# Patient Record
Sex: Female | Born: 2000 | Race: White | Hispanic: No | Marital: Single | Smoking: Never smoker
Health system: Southern US, Community
[De-identification: ages and names within clinical notes are randomized; demographics above are authoritative.]

## PROBLEM LIST (undated history)

## (undated) DIAGNOSIS — L309 Dermatitis, unspecified: Secondary | ICD-10-CM

## (undated) HISTORY — PX: ADENOIDECTOMY: SUR15

## (undated) HISTORY — PX: TONSILLECTOMY: SUR1361

## (undated) HISTORY — PX: MYRINGOTOMY WITH TUBE PLACEMENT: SHX5663

---
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
## 2022-02-24 ENCOUNTER — Emergency Department (HOSPITAL_COMMUNITY): Payer: BC Managed Care – PPO

## 2022-02-24 ENCOUNTER — Encounter (HOSPITAL_COMMUNITY): Payer: Self-pay | Admitting: Emergency Medicine

## 2022-02-24 ENCOUNTER — Other Ambulatory Visit: Payer: Self-pay

## 2022-02-24 ENCOUNTER — Emergency Department (HOSPITAL_COMMUNITY)
Admission: EM | Admit: 2022-02-24 | Discharge: 2022-02-24 | Disposition: A | Discharge status: Home / Self Care | Payer: BC Managed Care – PPO | Attending: Emergency Medicine | Admitting: Emergency Medicine

## 2022-02-24 DIAGNOSIS — R1110 Vomiting, unspecified: Secondary | ICD-10-CM | POA: Diagnosis not present

## 2022-02-24 DIAGNOSIS — D72829 Elevated white blood cell count, unspecified: Secondary | ICD-10-CM | POA: Diagnosis not present

## 2022-02-24 DIAGNOSIS — R1031 Right lower quadrant pain: Secondary | ICD-10-CM | POA: Diagnosis not present

## 2022-02-24 DIAGNOSIS — R1032 Left lower quadrant pain: Secondary | ICD-10-CM | POA: Diagnosis not present

## 2022-02-24 LAB — CBC WITH DIFFERENTIAL/PLATELET
Abs Immature Granulocytes: 0.02 10*3/uL (ref 0.00–0.07)
Basophils Absolute: 0.1 10*3/uL (ref 0.0–0.1)
Basophils Relative: 1 %
Eosinophils Absolute: 0.1 10*3/uL (ref 0.0–0.5)
Eosinophils Relative: 1 %
HCT: 37.7 % (ref 36.0–46.0)
Hemoglobin: 11.6 g/dL — ABNORMAL LOW (ref 12.0–15.0)
Immature Granulocytes: 0 %
Lymphocytes Relative: 33 %
Lymphs Abs: 3.5 10*3/uL (ref 0.7–4.0)
MCH: 25.8 pg — ABNORMAL LOW (ref 26.0–34.0)
MCHC: 30.8 g/dL (ref 30.0–36.0)
MCV: 83.8 fL (ref 80.0–100.0)
Monocytes Absolute: 0.7 10*3/uL (ref 0.1–1.0)
Monocytes Relative: 6 %
Neutro Abs: 6.4 10*3/uL (ref 1.7–7.7)
Neutrophils Relative %: 59 %
Platelets: 279 10*3/uL (ref 150–400)
RBC: 4.5 MIL/uL (ref 3.87–5.11)
RDW: 13.8 % (ref 11.5–15.5)
WBC: 10.7 10*3/uL — ABNORMAL HIGH (ref 4.0–10.5)
nRBC: 0 % (ref 0.0–0.2)

## 2022-02-24 LAB — COMPREHENSIVE METABOLIC PANEL
ALT: 18 U/L (ref 0–44)
AST: 18 U/L (ref 15–41)
Albumin: 3.9 g/dL (ref 3.5–5.0)
Alkaline Phosphatase: 64 U/L (ref 38–126)
Anion gap: 6 (ref 5–15)
BUN: 13 mg/dL (ref 6–20)
CO2: 25 mmol/L (ref 22–32)
Calcium: 9.1 mg/dL (ref 8.9–10.3)
Chloride: 105 mmol/L (ref 98–111)
Creatinine, Ser: 0.7 mg/dL (ref 0.44–1.00)
GFR, Estimated: >60 mL/min (ref >60)
Glucose, Bld: 152 mg/dL — ABNORMAL HIGH (ref 70–99)
Potassium: 3.7 mmol/L (ref 3.5–5.1)
Sodium: 136 mmol/L (ref 135–145)
Total Bilirubin: 0.4 mg/dL (ref 0.3–1.2)
Total Protein: 6.9 g/dL (ref 6.5–8.1)

## 2022-02-24 LAB — URINALYSIS, ROUTINE W REFLEX MICROSCOPIC
Bilirubin Urine: NEGATIVE
Glucose, UA: NEGATIVE mg/dL
Hgb urine dipstick: NEGATIVE
Ketones, ur: NEGATIVE mg/dL
Nitrite: NEGATIVE
Protein, ur: NEGATIVE mg/dL
Specific Gravity, Urine: 1.017 (ref 1.005–1.030)
pH: 6 (ref 5.0–8.0)

## 2022-02-24 LAB — I-STAT BETA HCG BLOOD, ED (MC, WL, AP ONLY): I-stat hCG, quantitative: <5 m[IU]/mL (ref <5)

## 2022-02-24 MED ORDER — IOHEXOL 300 MG/ML  SOLN
100.0000 mL | Freq: Once | INTRAMUSCULAR | Status: AC | PRN
  Administered 2022-02-24: 100 mL via INTRAVENOUS

## 2022-02-24 MED ORDER — ONDANSETRON HCL 4 MG/2ML IJ SOLN
4.0000 mg | Freq: Once | INTRAMUSCULAR | Status: AC
  Administered 2022-02-24: 4 mg via INTRAVENOUS
  Filled 2022-02-24: qty 2

## 2022-02-24 MED ORDER — SODIUM CHLORIDE (PF) 0.9 % IJ SOLN
INTRAMUSCULAR | Status: AC
  Filled 2022-02-24: qty 50

## 2022-02-24 MED ORDER — KETOROLAC TROMETHAMINE 15 MG/ML IJ SOLN
15.0000 mg | Freq: Once | INTRAMUSCULAR | Status: AC
  Administered 2022-02-24: 15 mg via INTRAVENOUS
  Filled 2022-02-24: qty 1

## 2022-02-24 NOTE — ED Triage Notes (Addendum)
Pt presents for  sudden onset lower abd pain (RLQ is greatest) that woke her from sleep, feels like cramping and stabbing. IUD placed in December (Mirena).  No previous episodes.  Denies urinary sx, fever Endorses chills, emesis x 4 Last PO intake 1 hr ago.  No abd surgical hx.  Last BM today.

## 2022-02-24 NOTE — ED Notes (Signed)
Unable to obtain UA sample. [place] at bedside. Will re-attempt later

## 2022-02-24 NOTE — Discharge Instructions (Addendum)
You were seen in the ER today for your abdominal pain.  While the exact cause of your symptoms remains unclear there is not appear to be any emergent problem at this time.  Symptoms may have been caused by a ruptured ovarian cyst.  Please follow-up with a primary care doctor for inflammatory changes around your aorta which were noted on your CT scan.  You may take Tylenol or ibuprofen as needed for your symptoms.  Return to the ER with any new severe symptoms.

## 2022-02-24 NOTE — ED Provider Notes (Signed)
[facility] Provider Note   CSN: [id number] Arrival date & time: 02/24/22  0140     History  Chief Complaint  Patient presents with   Abdominal Pain    Sabrina Humphrey is a 22 y.o. female who presents with her girlfriend at the bedside with concern for sudden onset right lower abdominal pain that woke her from her sleep.  States that around 11 PM she and her partner had intercourse, around 1230 began to have some mild vague soreness in the right side.  Fell asleep and around 1:30 AM woke up reportedly screaming in pain secondary to pain in the right lower abdomen, radiates to the left side at this time.  IUD placed in December, Mirena.  Denies vaginal bleeding or discharge at this time.  Sharp stabbing pain.  Associated vomiting with NBNB emesis when pain was most severe.  Endorses chills sensation.  No surgical history in the abdomen.  Last bowel movement today was normal, no urinary symptoms.    HPI     Home Medications Prior to Admission medications   Medication Sig Start Date End Date Taking? Authorizing Provider  ondansetron (ZOFRAN) 4 MG tablet Take 1 tablet (4 mg total) by mouth every 6 (six) hours. 04/23/16   Noe Gens, PA-C  UNKNOWN TO PATIENT     [provider]      Allergies    Penicillins and Sulfa antibiotics    Review of Systems   Review of Systems  Constitutional:  Positive for appetite change and chills. Negative for activity change, fatigue and fever.  HENT:  Positive for congestion.   Gastrointestinal:  Positive for abdominal pain, nausea and vomiting. Negative for anal bleeding, blood in stool, constipation and diarrhea.  Genitourinary:  Positive for pelvic pain. Negative for decreased urine volume, dysuria, enuresis, flank pain, frequency, genital sores, hematuria, menstrual problem, urgency, vaginal bleeding, vaginal discharge and vaginal pain.    Physical Exam Updated Vital Signs BP 99/63 (BP  Location: Right Arm)   Pulse 62   Temp 97.8 F (36.6 C)   Resp 18   Wt 70.3 kg   SpO2 99%  Physical Exam Vitals and nursing note reviewed.  Constitutional:      Appearance: She is not ill-appearing or toxic-appearing.  HENT:     Head: Normocephalic and atraumatic.     Mouth/Throat:     Mouth: Mucous membranes are moist.     Pharynx: No oropharyngeal exudate or posterior oropharyngeal erythema.  Eyes:     General:        Right eye: No discharge.        Left eye: No discharge.     Conjunctiva/sclera: Conjunctivae normal.  Cardiovascular:     Rate and Rhythm: Normal rate and regular rhythm.     Pulses: Normal pulses.     Heart sounds: Normal heart sounds. No murmur heard. Pulmonary:     Effort: Pulmonary effort is normal. No respiratory distress.     Breath sounds: Normal breath sounds. No wheezing or rales.  Abdominal:     General: Bowel sounds are normal. There is no distension.     Palpations: Abdomen is soft.     Tenderness: There is abdominal tenderness in the right lower quadrant, suprapubic area and left lower quadrant. There is no right CVA tenderness, left CVA tenderness, guarding or rebound.     Hernia: No hernia is present.  Musculoskeletal:        General: No  deformity.     Cervical back: Neck supple.  Skin:    General: Skin is warm and dry.     Capillary Refill: Capillary refill takes less than 2 seconds.  Neurological:     General: No focal deficit present.     Mental Status: She is alert and oriented to person, place, and time. Mental status is at baseline.  Psychiatric:        Mood and Affect: Mood normal.     ED Results / Procedures / Treatments   Labs (all labs ordered are listed, but only abnormal results are displayed) Labs Reviewed  CBC WITH DIFFERENTIAL/PLATELET - Abnormal; Notable for the following components:      Result Value   WBC 10.7 (*)    Hemoglobin 11.6 (*)    MCH 25.8 (*)    All other components within normal limits  COMPREHENSIVE  METABOLIC PANEL - Abnormal; Notable for the following components:   Glucose, Bld 152 (*)    All other components within normal limits  URINALYSIS, ROUTINE W REFLEX MICROSCOPIC - Abnormal; Notable for the following components:   APPearance HAZY (*)    Leukocytes,Ua TRACE (*)    Bacteria, UA FEW (*)    All other components within normal limits  I-STAT BETA HCG BLOOD, ED ([facility], [facility], [facility] ONLY)    EKG None  Radiology CT ABDOMEN PELVIS W CONTRAST  Result Date: 02/24/2022 CLINICAL DATA:  Right lower quadrant pain. EXAM: CT ABDOMEN AND PELVIS WITH CONTRAST TECHNIQUE: Multidetector CT imaging of the abdomen and pelvis was performed using the standard protocol following bolus administration of intravenous contrast. RADIATION DOSE REDUCTION: This exam was performed according to the departmental dose-optimization program which includes automated exposure control, adjustment of the mA and/or kV according to patient size and/or use of iterative reconstruction technique. CONTRAST:  147mL OMNIPAQUE IOHEXOL 300 MG/ML  SOLN COMPARISON:  Pelvic ultrasound earlier today showing no significant findings. FINDINGS: Lower chest: No acute abnormality. Hepatobiliary: Gallbladder is contracted and not well seen but there is no pericholecystic edema, fluid or biliary dilatation. No focal liver parenchymal abnormality. Pancreas: No abnormality. Spleen: No abnormality. Adrenals/Urinary Tract: Adrenal glands are unremarkable. Kidneys are normal, without renal calculi, focal lesion, or hydronephrosis. Bladder is unremarkable. Stomach/Bowel: The gastric wall and unopacified small bowel are unremarkable. An appendix is not seen in this patient. There is moderate retained stool in the ascending, transverse and descending colon. The rectosigmoid segment is unremarkable. There are no erosive colitis or diverticulitis. Vascular/Lymphatic: No significant vascular findings are present. No enlarged abdominal or pelvic lymph nodes. Reproductive:  The uterus is intact, anteverted and tilted right with IUD in place with the T arms in the distal cavity. The ovaries are not enlarged. There is mild low-density cul-de-sac free fluid which was seen on the ultrasound and some of it tracks into the distal most paracolic gutters. Finding is nonspecific, could be physiologic or due to a ruptured ovarian cyst. Other: As above there is mild fluid in the pelvis and distal paracolic gutters. There is no free hemorrhage, free air, abscess or acute inflammatory changes. There is subtle fat stranding the retroperitoneum along both sides of the abdominal aorta, nonspecific and could be due to an inflammatory process whether active or prior, or could be due to retroperitoneal fibrosis. There are no incarcerated hernias. There is a tiny umbilical fat hernia. Musculoskeletal: Transitional anatomy lumbosacral junction. Slight lumbar levoscoliosis. No acute or significant osseous findings. IMPRESSION: 1. Mild low-density cul-de-sac free fluid which was seen  on the pelvic ultrasound. This could be physiologic or due to a ruptured ovarian cyst. 2. Appendix not seen in this patient. No secondary findings of appendicitis. 3. Constipation. 4. Subtle fat stranding along both sides of the abdominal aorta, nonspecific. This could be due to an inflammatory process whether active or prior, or could be due to retroperitoneal fibrosis. Electronically Signed   By: Almira Bar M.D.   On: 02/24/2022 04:57   US Pelvis Complete  Result Date: 02/24/2022 CLINICAL DATA:  Sudden onset right-sided pelvic pain for 2 hours. Unsure LMP. EXAM: TRANSABDOMINAL ULTRASOUND OF PELVIS DOPPLER ULTRASOUND OF OVARIES TECHNIQUE: Transabdominal ultrasound examination of the pelvis was performed including evaluation of the uterus, ovaries, adnexal regions, and pelvic cul-de-sac. Color and duplex Doppler ultrasound was utilized to evaluate blood flow to the ovaries. COMPARISON:  None Available. FINDINGS: Uterus  Measurements: 6 x 3.1 x 5.1 cm = volume: 49 mL. Uterus is anteverted. No fibroids or other mass visualized. Endometrium Thickness: 5 mm. Echogenic filling defect centrally in the endometrium consistent with intrauterine device. Right ovary Measurements: 4 x 1.6 x 2.6 cm = volume: 9 mL. Normal appearance/no adnexal mass. Left ovary Measurements: 4 x 1.5 x 1.6 cm = volume: 5 mL. Normal appearance/no adnexal mass. Pulsed Doppler evaluation demonstrates normal low-resistance arterial and venous waveforms in both ovaries. Other: Trace free fluid in the pelvis is nonspecific but likely physiologic. IMPRESSION: Normal ultrasound appearance of the uterus and ovaries. No evidence of ovarian mass or torsion. Small amount of free fluid in the pelvis is likely physiologic. IUD is present. Electronically Signed   By: Burman Nieves M.D.   On: 02/24/2022 03:30   [place] Art/Ven Flow Abd Pelv Doppler  Result Date: 02/24/2022 CLINICAL DATA:  Sudden onset right-sided pelvic pain for 2 hours. Unsure LMP. EXAM: TRANSABDOMINAL ULTRASOUND OF PELVIS DOPPLER ULTRASOUND OF OVARIES TECHNIQUE: Transabdominal ultrasound examination of the pelvis was performed including evaluation of the uterus, ovaries, adnexal regions, and pelvic cul-de-sac. Color and duplex Doppler ultrasound was utilized to evaluate blood flow to the ovaries. COMPARISON:  None Available. FINDINGS: Uterus Measurements: 6 x 3.1 x 5.1 cm = volume: 49 mL. Uterus is anteverted. No fibroids or other mass visualized. Endometrium Thickness: 5 mm. Echogenic filling defect centrally in the endometrium consistent with intrauterine device. Right ovary Measurements: 4 x 1.6 x 2.6 cm = volume: 9 mL. Normal appearance/no adnexal mass. Left ovary Measurements: 4 x 1.5 x 1.6 cm = volume: 5 mL. Normal appearance/no adnexal mass. Pulsed Doppler evaluation demonstrates normal low-resistance arterial and venous waveforms in both ovaries. Other: Trace free fluid in the pelvis is nonspecific but  likely physiologic. IMPRESSION: Normal ultrasound appearance of the uterus and ovaries. No evidence of ovarian mass or torsion. Small amount of free fluid in the pelvis is likely physiologic. IUD is present. Electronically Signed   By: Burman Nieves M.D.   On: 02/24/2022 03:30    Procedures Procedures    Medications Ordered in ED Medications  sodium chloride (PF) 0.9 % injection (  Canceled Entry 02/24/22 0424)  ondansetron (ZOFRAN) injection 4 mg (4 mg Intravenous Given 02/24/22 0217)  ketorolac (TORADOL) 15 MG/ML injection 15 mg (15 mg Intravenous Given 02/24/22 0217)  iohexol (OMNIPAQUE) 300 MG/ML solution 100 mL (100 mLs Intravenous Contrast Given 02/24/22 9604)    ED Course/ Medical Decision Making/ A&P  Medical Decision Making 22 year old female who presents with sudden onset right lower abdominal pain.  Normal vital signs on intake.  Cardiopulmonary exam is normal, abdominal exam as above with lower abdominal tenderness palpation generally worse right greater than left.  Pharyngitis includes limited to ovarian torsion, ovarian cyst, appendicitis, diverticulitis, psoas abscess, bowel obstruction, gastroenteritis, cystitis, nephrolithiasis/ureterolithiasis.  Amount and/or Complexity of Data Reviewed Labs: ordered.    Details: CBC with leukocytosis of 10.7, mild anemia with hbg of 11.6. CMP unremarkable, patient is not pregnant.  Radiology: ordered.    Details: US pelvis without acute abnormality; no torsion. Visualized by this provider.  CT with some free fluid in the pelvis possible ruptured ovarian cyst.  No evidence of appendicitis, some constipation.  Subtle fat stranding on both sides of the abdominal aorta nonspecific.  Recommend outpatient follow-up.    Risk Prescription drug management.   Patient reevaluated with significant improvement in her symptoms following Toradol.  Clinical picture most consistent with ruptured ovarian cyst.  Clinical  concern for emergent underlying etiology that would warrant further ED workup or inpatient management is exceedingly low.  Recommend close outpatient follow-up for incidental findings of inflammatory changes near the aorta.  No occasion for admission to the hospital at this time.  Sabrina Humphrey voiced understanding of her medical evaluation and treatment plan. Each of their questions answered to their expressed satisfaction.  Return precautions were given.  Patient is well-appearing, stable, and was discharged in good condition.  This chart was dictated using voice recognition software, Dragon. Despite the best efforts of this provider to proofread and correct errors, errors may still occur which can change documentation meaning.   Final Clinical Impression(s) / ED Diagnoses Final diagnoses:  Right lower quadrant abdominal pain    Rx / DC Orders ED Discharge Orders     None         Aura Dials 02/24/22 8182    Quintella Reichert, MD 02/24/22 2303

## 2022-02-24 NOTE — ED Notes (Signed)
Pt states she can not give an urine specimen bc she just used the restroom

## 2023-03-20 ENCOUNTER — Emergency Department (HOSPITAL_BASED_OUTPATIENT_CLINIC_OR_DEPARTMENT_OTHER): Payer: Self-pay

## 2023-03-20 ENCOUNTER — Encounter (HOSPITAL_BASED_OUTPATIENT_CLINIC_OR_DEPARTMENT_OTHER): Payer: Self-pay

## 2023-03-20 ENCOUNTER — Other Ambulatory Visit: Payer: Self-pay

## 2023-03-20 ENCOUNTER — Emergency Department (HOSPITAL_BASED_OUTPATIENT_CLINIC_OR_DEPARTMENT_OTHER)
Admission: EM | Admit: 2023-03-20 | Discharge: 2023-03-20 | Disposition: A | Discharge status: Home / Self Care | Payer: Self-pay | Attending: Emergency Medicine | Admitting: Emergency Medicine

## 2023-03-20 DIAGNOSIS — R102 Pelvic and perineal pain: Secondary | ICD-10-CM | POA: Insufficient documentation

## 2023-03-20 DIAGNOSIS — D72829 Elevated white blood cell count, unspecified: Secondary | ICD-10-CM | POA: Insufficient documentation

## 2023-03-20 DIAGNOSIS — K529 Noninfective gastroenteritis and colitis, unspecified: Secondary | ICD-10-CM | POA: Insufficient documentation

## 2023-03-20 DIAGNOSIS — R109 Unspecified abdominal pain: Secondary | ICD-10-CM

## 2023-03-20 DIAGNOSIS — R112 Nausea with vomiting, unspecified: Secondary | ICD-10-CM

## 2023-03-20 LAB — CBC
HCT: 48.3 % — ABNORMAL HIGH (ref 36.0–46.0)
Hemoglobin: 15.8 g/dL — ABNORMAL HIGH (ref 12.0–15.0)
MCH: 27.9 pg (ref 26.0–34.0)
MCHC: 32.7 g/dL (ref 30.0–36.0)
MCV: 85.2 fL (ref 80.0–100.0)
Platelets: 318 10*3/uL (ref 150–400)
RBC: 5.67 MIL/uL — ABNORMAL HIGH (ref 3.87–5.11)
RDW: 12.6 % (ref 11.5–15.5)
WBC: 15.1 10*3/uL — ABNORMAL HIGH (ref 4.0–10.5)
nRBC: 0 % (ref 0.0–0.2)

## 2023-03-20 LAB — URINALYSIS, ROUTINE W REFLEX MICROSCOPIC
Bilirubin Urine: NEGATIVE
Glucose, UA: NEGATIVE mg/dL
Hgb urine dipstick: NEGATIVE
Ketones, ur: 40 mg/dL — AB
Leukocytes,Ua: NEGATIVE
Nitrite: NEGATIVE
Protein, ur: NEGATIVE mg/dL
Specific Gravity, Urine: 1.023 (ref 1.005–1.030)
pH: 7.5 (ref 5.0–8.0)

## 2023-03-20 LAB — WET PREP, GENITAL
Clue Cells Wet Prep HPF POC: NONE SEEN
Sperm: NONE SEEN
Trich, Wet Prep: NONE SEEN
WBC, Wet Prep HPF POC: <10 (ref <10)
Yeast Wet Prep HPF POC: NONE SEEN

## 2023-03-20 LAB — HCG, SERUM, QUALITATIVE: Preg, Serum: NEGATIVE

## 2023-03-20 LAB — COMPREHENSIVE METABOLIC PANEL
ALT: 18 U/L (ref 0–44)
AST: 17 U/L (ref 15–41)
Albumin: 5.2 g/dL — ABNORMAL HIGH (ref 3.5–5.0)
Alkaline Phosphatase: 74 U/L (ref 38–126)
Anion gap: 8 (ref 5–15)
BUN: 15 mg/dL (ref 6–20)
CO2: 25 mmol/L (ref 22–32)
Calcium: 9.4 mg/dL (ref 8.9–10.3)
Chloride: 106 mmol/L (ref 98–111)
Creatinine, Ser: 0.89 mg/dL (ref 0.44–1.00)
GFR, Estimated: >60 mL/min (ref >60)
Glucose, Bld: 132 mg/dL — ABNORMAL HIGH (ref 70–99)
Potassium: 4.2 mmol/L (ref 3.5–5.1)
Sodium: 139 mmol/L (ref 135–145)
Total Bilirubin: 0.8 mg/dL (ref 0.0–1.2)
Total Protein: 7.8 g/dL (ref 6.5–8.1)

## 2023-03-20 LAB — LACTIC ACID, PLASMA: Lactic Acid, Venous: 1.1 mmol/L (ref 0.5–1.9)

## 2023-03-20 LAB — HCG, QUANTITATIVE, PREGNANCY
hCG, Beta Chain, Quant, S: <1050 m[IU]/mL — ABNORMAL HIGH (ref <5)
hCG, Beta Chain, Quant, S: <1 m[IU]/mL (ref <5)

## 2023-03-20 LAB — LIPASE, BLOOD: Lipase: 24 U/L (ref 11–51)

## 2023-03-20 MED ORDER — MORPHINE SULFATE (PF) 4 MG/ML IV SOLN
4.0000 mg | Freq: Once | INTRAVENOUS | Status: AC
  Administered 2023-03-20: 4 mg via INTRAVENOUS
  Filled 2023-03-20: qty 1

## 2023-03-20 MED ORDER — IOHEXOL 300 MG/ML  SOLN
100.0000 mL | Freq: Once | INTRAMUSCULAR | Status: AC | PRN
  Administered 2023-03-20: 100 mL via INTRAVENOUS

## 2023-03-20 MED ORDER — ONDANSETRON HCL 4 MG/2ML IJ SOLN
4.0000 mg | Freq: Once | INTRAMUSCULAR | Status: AC
  Administered 2023-03-20: 4 mg via INTRAVENOUS
  Filled 2023-03-20: qty 2

## 2023-03-20 MED ORDER — SODIUM CHLORIDE 0.9 % IV BOLUS
1000.0000 mL | Freq: Once | INTRAVENOUS | Status: AC
  Administered 2023-03-20: 1000 mL via INTRAVENOUS

## 2023-03-20 MED ORDER — PROMETHAZINE HCL 25 MG/ML IJ SOLN
INTRAMUSCULAR | Status: AC
  Filled 2023-03-20: qty 1

## 2023-03-20 MED ORDER — PROMETHAZINE HCL 25 MG PO TABS: 25.0000 mg | ORAL_TABLET | Freq: Three times a day (TID) | ORAL | 0 refills | Status: AC | PRN

## 2023-03-20 MED ORDER — SODIUM CHLORIDE 0.9 % IV SOLN
25.0000 mg | Freq: Once | INTRAVENOUS | Status: AC
  Administered 2023-03-20: 25 mg via INTRAVENOUS
  Filled 2023-03-20: qty 1

## 2023-03-20 MED ORDER — OXYCODONE-ACETAMINOPHEN 5-325 MG PO TABS: 1.0000 | ORAL_TABLET | Freq: Four times a day (QID) | ORAL | 0 refills | Status: AC | PRN

## 2023-03-20 NOTE — ED Notes (Signed)
 Reviewed AVS/discharge instruction with patient. Time allotted for and all questions answered. Patient is agreeable for d/c and escorted to ed exit by staff.

## 2023-03-20 NOTE — ED Triage Notes (Signed)
 Woke up with pain similar to uterine cyst she has had in the past, started vomiting and has had 25 episodes of vomiting since then. She does not smoke marijuana. The pain has since subsided. She denies being pregnant. She mentions being fatigued at this time from all of the vomiting but is otherwise stable and pleasant during triage.,

## 2023-03-20 NOTE — ED Notes (Signed)
 Spoke with lab to add on qual hcg

## 2023-03-20 NOTE — ED Provider Notes (Signed)
 [place] [facility]PARTMENT AT [facility] Provider Note   CSN: [id number] Arrival date & time: 03/20/23  4098     History  Chief Complaint  Patient presents with   Abdominal Pain    Sabrina Humphrey is a 23 y.o. female.  The history is provided by the patient, medical records and a friend. No language interpreter was used.  Abdominal Pain Pain location:  Generalized Pain quality: aching, cramping and sharp   Pain radiates to:  Does not radiate Pain severity:  Severe Onset quality:  Sudden Duration:  5 hours Timing:  Constant Progression:  Waxing and waning Chronicity:  Recurrent Relieved by:  Nothing Worsened by:  Movement and palpation Ineffective treatments:  Bowel activity Associated symptoms: chills, diarrhea, fatigue, nausea and vomiting   Associated symptoms: no chest pain, no constipation, no cough, no dysuria, no fever, no shortness of breath, no vaginal bleeding and no vaginal discharge        Home Medications Prior to Admission medications   Medication Sig Start Date End Date Taking? Authorizing Provider  ondansetron (ZOFRAN) 4 MG tablet Take 1 tablet (4 mg total) by mouth every 6 (six) hours. 04/23/16   Lurene Shadow, PA-C  UNKNOWN TO PATIENT     [provider]      Allergies    Penicillins and Sulfa antibiotics    Review of Systems   Review of Systems  Constitutional:  Positive for chills and fatigue. Negative for fever.  HENT:  Negative for congestion.   Respiratory:  Negative for cough, chest tightness, shortness of breath and wheezing.   Cardiovascular:  Negative for chest pain and palpitations.  Gastrointestinal:  Positive for abdominal pain, diarrhea, nausea and vomiting. Negative for constipation.  Genitourinary:  Positive for pelvic pain. Negative for dysuria, flank pain, frequency, vaginal bleeding and vaginal discharge.  Musculoskeletal:  Negative for back pain and neck pain.  Skin:  Negative for rash.  Neurological:   Negative for headaches.  Psychiatric/Behavioral:  Negative for agitation.     Physical Exam Updated Vital Signs BP 111/83   Pulse (!) 114   Temp 98.4 F (36.9 C)   Resp 18   SpO2 100%  Physical Exam Vitals and nursing note reviewed. Exam conducted with a chaperone present.  Constitutional:      General: She is not in acute distress.    Appearance: She is well-developed. She is not ill-appearing, toxic-appearing or diaphoretic.  HENT:     Head: Normocephalic and atraumatic.  Eyes:     Conjunctiva/sclera: Conjunctivae normal.  Cardiovascular:     Rate and Rhythm: Regular rhythm. Tachycardia present.     Heart sounds: No murmur heard. Pulmonary:     Effort: Pulmonary effort is normal. No respiratory distress.     Breath sounds: Normal breath sounds.  Abdominal:     General: Abdomen is flat. Bowel sounds are normal. There is no distension.     Palpations: Abdomen is soft.     Tenderness: There is generalized abdominal tenderness. There is no right CVA tenderness, left CVA tenderness, guarding or rebound.  Genitourinary:    Labia:        Right: No tenderness.        Left: No tenderness.      Vagina: No tenderness.     Cervix: No cervical motion tenderness, discharge or erythema.     Uterus: Not tender.      Adnexa:        Right: No tenderness.  Left: Tenderness present.   Musculoskeletal:        General: No swelling.     Cervical back: Neck supple.  Skin:    General: Skin is warm and dry.     Capillary Refill: Capillary refill takes less than 2 seconds.  Neurological:     Mental Status: She is alert.  Psychiatric:        Mood and Affect: Mood normal.     ED Results / Procedures / Treatments   Labs (all labs ordered are listed, but only abnormal results are displayed) Labs Reviewed  COMPREHENSIVE METABOLIC PANEL - Abnormal; Notable for the following components:      Result Value   Glucose, Bld 132 (*)    Albumin 5.2 (*)    All other components within  normal limits  CBC - Abnormal; Notable for the following components:   WBC 15.1 (*)    RBC 5.67 (*)    Hemoglobin 15.8 (*)    HCT 48.3 (*)    All other components within normal limits  URINALYSIS, ROUTINE W REFLEX MICROSCOPIC - Abnormal; Notable for the following components:   Ketones, ur 40 (*)    All other components within normal limits  HCG, QUANTITATIVE, PREGNANCY - Abnormal; Notable for the following components:   hCG, Beta Chain, Quant, S <1,050 (*)    All other components within normal limits  WET PREP, GENITAL  LIPASE, BLOOD  LACTIC ACID, PLASMA  HCG, SERUM, QUALITATIVE  HCG, QUANTITATIVE, PREGNANCY  LACTIC ACID, PLASMA  GC/CHLAMYDIA PROBE AMP ([place]) NOT AT [facility]    EKG None  Radiology US PELVIC COMPLETE W TRANSVAGINAL AND TORSION R/O Result Date: 03/20/2023 CLINICAL DATA:  Left adnexal tenderness, patient reports history of PCOS EXAM: TRANSABDOMINAL AND TRANSVAGINAL ULTRASOUND OF PELVIS DOPPLER ULTRASOUND OF OVARIES TECHNIQUE: Both transabdominal and transvaginal ultrasound examinations of the pelvis were performed. Transabdominal technique was performed for global imaging of the pelvis including uterus, ovaries, adnexal regions, and pelvic cul-de-sac. It was necessary to proceed with endovaginal exam following the transabdominal exam to visualize the left ovary and adnexa. Color and duplex Doppler ultrasound was utilized to evaluate blood flow to the ovaries. COMPARISON:  CT abdomen pelvis, 03/20/2023 FINDINGS: Uterus Measurements: 6.2 x 3.7 x 4.7 cm = volume: 57 mL. No fibroids or other mass visualized. Endometrium Thickness: 1.0 cm.  No focal abnormality visualized. Right ovary Measurements: 2.6 x 1.8 x 1.2 cm = volume: 3 mL. Normal appearance/no adnexal mass. Small follicles. Left ovary Measurements: 4.1 x 2.0 x 2.0 cm = volume: 9 mL. Normal appearance/no adnexal mass. Small follicles. Pulsed Doppler evaluation of both ovaries demonstrates normal low-resistance arterial  and venous waveforms. Other findings Small volume free fluid in the low pelvis. IMPRESSION: 1. Small volume free fluid in the low pelvis, nonspecific and likely physiologic in the reproductive age setting. 2. Small follicles in the ovaries. Arterial and venous Doppler flow is present bilaterally. Electronically Signed   By: Jearld Lesch M.D.   On: 03/20/2023 11:18   CT ABDOMEN PELVIS W CONTRAST Result Date: 03/20/2023 CLINICAL DATA:  Abdominal pain and vomiting. EXAM: CT ABDOMEN AND PELVIS WITH CONTRAST TECHNIQUE: Multidetector CT imaging of the abdomen and pelvis was performed using the standard protocol following bolus administration of intravenous contrast. RADIATION DOSE REDUCTION: This exam was performed according to the departmental dose-optimization program which includes automated exposure control, adjustment of the mA and/or kV according to patient size and/or use of iterative reconstruction technique. CONTRAST:  OMNIPAQUE IOHEXOL 300  MG/ML  SOLN COMPARISON:  02/24/2022 FINDINGS: Lower chest: No acute abnormality. Hepatobiliary: No focal liver abnormality is seen. No gallstones, gallbladder wall thickening, or biliary dilatation. Pancreas: Unremarkable. No pancreatic ductal dilatation or surrounding inflammatory changes. Spleen: Normal in size without focal abnormality. Adrenals/Urinary Tract: Adrenal glands are unremarkable. Kidneys are normal, without renal calculi, focal lesion, or hydronephrosis. Bladder is unremarkable. Stomach/Bowel: There is fluid in multiple nondilated small bowel loops and the ascending and transverse colon which may reflect acute enteritis. No overt bowel obstruction or free intraperitoneal air is identified. There is a focal calcified appendicolith in the distal appendix without appendiceal thickening or inflammation to suggest acute appendicitis. Vascular/Lymphatic: No significant vascular findings are present. No enlarged abdominal or pelvic lymph nodes. Reproductive:  Uterus and bilateral adnexa are unremarkable. Removal of IUD since the prior CT. Other: No abdominal wall hernia or abnormality. No evidence of focal abscess or ascites. Musculoskeletal: No acute or significant osseous findings. IMPRESSION: 1. Fluid in multiple nondilated small bowel loops and the ascending and transverse colon which may reflect acute enteritis. No overt bowel obstruction or free intraperitoneal air is identified. 2. Focal calcified appendicolith in the distal appendix without evidence of acute appendicitis. Electronically Signed   By: Irish Lack M.D.   On: 03/20/2023 11:02    Procedures Procedures    Medications Ordered in ED Medications  promethazine (PHENERGAN) 25 MG/ML injection (  Not Given 03/20/23 1216)  morphine (PF) 4 MG/ML injection 4 mg (4 mg Intravenous Given 03/20/23 0723)  ondansetron (ZOFRAN) injection 4 mg (4 mg Intravenous Given 03/20/23 0724)  sodium chloride 0.9 % bolus 1,000 mL (0 mLs Intravenous Stopped 03/20/23 0849)  morphine (PF) 4 MG/ML injection 4 mg (4 mg Intravenous Given 03/20/23 0933)  iohexol (OMNIPAQUE) 300 MG/ML solution 100 mL (100 mLs Intravenous Contrast Given 03/20/23 0943)  promethazine (PHENERGAN) 25 mg in sodium chloride 0.9 % 50 mL IVPB (0 mg Intravenous Stopped 03/20/23 1259)    ED Course/ Medical Decision Making/ A&P                                 Medical Decision Making Amount and/or Complexity of Data Reviewed Labs: ordered. Radiology: ordered.  Risk Prescription drug management.    Ashritha Desrosiers is a 23 y.o. female with a past medical history significant for eczema and patient report of previous polycystic ovarian syndrome who presents with abdominal pain.  According to patient, she will go about 2 AM with severe pain across her abdomen.  She reports it started in her lower abdomen but is now diffuse.  She reports nausea and vomiting has not been able to stop vomiting.  She reports no fevers but has some chronic chills.  She  denies any chest pain or shortness of breath.  He reports several bowel movements that were loose but there was no blood in the stools.  Denies any vaginal bleeding or vaginal discharge and says she is not pregnant.  She does not report any trauma and has other complaints.  Denies dysuria or hematuria and denies any history of kidney stones.  Reports the pain is a 10 out of 10 in severity and patient peers very uncomfortable.  On exam, lungs clear.  Chest nontender.  Back and flanks nontender.  Patient has severe tenderness all across her abdomen but does have bowel sounds.  Patient has dry mucous membranes, will give some fluids.  Will do pelvic exam with  a chaperone given her history of polycystic ovarian troubles and her report that her pain initially felt similar to previous ovarian cyst.  However given the tenderness all of her abdomen and tachycardia and leukocytosis I do want to make sure were not missing some other etiology so she will get a CT scan of the abdomen pelvis, will get labs, will get a pelvic exam and pelvic ultrasound after her pregnancy test has returned, and will get screening labs.  Clinical aspect this is peritoneal discomfort after a ruptured cyst as she has had in the past that may have been worse than prior.  However we will rule out other etiologies as well.  Anticipate reassessment after exam and workup.  Ultrasound and CT scan did not show acute surgical problem.  She has no evidence of enteritis and the ultrasound showed some follicles in the ovaries.  No evidence of torsion.  Some free fluid present likely due to physiologic fluid versus recently ruptured cyst given her history.  Patient is feeling better after medications.  We feel she is safe for discharge home.  We did shared decision-making conversation and agree with prescription for pain medicine and nausea medicine and close follow-up with her PCP and OB/GYN.  She had otherwise no concerns and was discharged in  good condition.         Final Clinical Impression(s) / ED Diagnoses Final diagnoses:  Abdominal pain, unspecified abdominal location  Enteritis  Nausea vomiting and diarrhea  Pelvic pain    Rx / DC Orders ED Discharge Orders          Ordered    oxyCODONE-acetaminophen (PERCOCET/ROXICET) 5-325 MG tablet  Every 6 hours PRN        03/20/23 1337    promethazine (PHENERGAN) 25 MG tablet  Every 8 hours PRN        03/20/23 1337            Clinical Impression: 1. Abdominal pain, unspecified abdominal location   2. Enteritis   3. Nausea vomiting and diarrhea   4. Pelvic pain     Disposition: Discharge  Condition: Good  I have discussed the results, Dx and Tx plan with the pt(& family if present). He/she/they expressed understanding and agree(s) with the plan. Discharge instructions discussed at great length. Strict return precautions discussed and pt &/or family have verbalized understanding of the instructions. No further questions at time of discharge.    Discharge Medication List as of 03/20/2023  1:44 PM     START taking these medications   Details  oxyCODONE-acetaminophen (PERCOCET/ROXICET) 5-325 MG tablet Take 1 tablet by mouth every 6 (six) hours as needed for severe pain (pain score 7-10)., Starting Mon 03/20/2023, Normal    promethazine (PHENERGAN) 25 MG tablet Take 1 tablet (25 mg total) by mouth every 8 (eight) hours as needed for nausea or vomiting., Starting Mon 03/20/2023, Normal        Follow Up: [facility] [address] [place] [address] [place] [address] [phone] Schedule an appointment as soon as possible for a visit    your PCP and OBGYN         Aleli Navedo, Canary Brim, MD 03/20/23 [phone]

## 2023-03-20 NOTE — Discharge Instructions (Addendum)
 Your history, exam, workup today revealed evidence of some enteritis likely from a GI bug and some free fluid in the pelvis likely this is physiologic versus from a new ovarian cyst as you have had them before.  Your ultrasound and imaging did not show other surgical problems or acute findings and your labs were also reassuring.  You were somewhat dehydrated.  Please rest and stay hydrated and use the pain medicine nausea medicine and follow-up with your primary doctor or OB/GYN.  If any symptoms change or worsen acutely, please return to the nearest emergency department.

## 2023-03-21 LAB — GC/CHLAMYDIA PROBE AMP (~~LOC~~) NOT AT ARMC
Chlamydia: NEGATIVE
Comment: NEGATIVE
Comment: NORMAL
Neisseria Gonorrhea: NEGATIVE
